# Patient Record
Sex: Female | Born: 1985 | Race: White | Hispanic: No | Marital: Married | Smoking: Former smoker
Health system: Southern US, Community
[De-identification: ages and names within clinical notes are randomized; demographics above are authoritative.]

## PROBLEM LIST (undated history)

## (undated) DIAGNOSIS — K5900 Constipation, unspecified: Secondary | ICD-10-CM

## (undated) DIAGNOSIS — I2510 Atherosclerotic heart disease of native coronary artery without angina pectoris: Secondary | ICD-10-CM

## (undated) DIAGNOSIS — E282 Polycystic ovarian syndrome: Secondary | ICD-10-CM

## (undated) DIAGNOSIS — G47419 Narcolepsy without cataplexy: Secondary | ICD-10-CM

## (undated) DIAGNOSIS — I341 Nonrheumatic mitral (valve) prolapse: Secondary | ICD-10-CM

## (undated) HISTORY — DX: Constipation, unspecified: K59.00

## (undated) HISTORY — DX: Polycystic ovarian syndrome: E28.2

## (undated) HISTORY — DX: Nonrheumatic mitral (valve) prolapse: I34.1

## (undated) HISTORY — PX: TONSILLECTOMY AND ADENOIDECTOMY: SHX28

## (undated) HISTORY — PX: CHOLECYSTECTOMY: SHX55

## (undated) HISTORY — DX: Narcolepsy without cataplexy: G47.419

## (undated) HISTORY — DX: Atherosclerotic heart disease of native coronary artery without angina pectoris: I25.10

---

## 2017-01-10 NOTE — Congregational Nurse Program (Signed)
Congregational Nurse Program Note  Date of Encounter: 01/10/2017  Past Medical History: No past medical history on file.  Encounter Details:     CNP Questionnaire - 12/21/16 1745      Patient Demographics   Is this a new or existing patient? New   Patient is considered a/an Not Applicable   Race Caucasian/White     Patient Assistance   Location of Patient Assistance Home of [address], [place]   Patient's financial/insurance status Medicaid   Uninsured Patient ([place] [facility]/Care Connects) No   Patient referred to apply for the following financial assistance Not Applicable   Food insecurities addressed Not Applicable   Transportation assistance Yes   Type of Assistance [facility]   Assistance securing medications No   Educational health offerings Chronic disease;Navigating the healthcare system     Encounter Details   Primary purpose of visit Chronic Illness/Condition Visit;Navigating the Healthcare System   Was an Emergency Department visit averted? Not Applicable   Does patient have a medical provider? Yes   Patient referred to Not Applicable   Was a mental health screening completed? (GAINS tool) No   Does patient have dental issues? No   Does patient have vision issues? No   Does your patient have an abnormal blood pressure today? No   Since previous encounter, have you referred patient for abnormal blood pressure that resulted in a new diagnosis or medication change? No   Does your patient have an abnormal blood glucose today? No   Since previous encounter, have you referred patient for abnormal blood glucose that resulted in a new diagnosis or medication change? No   Was there a life-saving intervention made? No    Client has chronic nephritis;on Bactrim 1 daily for 6 months. Had appointment  Today at [facility] but did not know to call [facility] 3 days in advance for transportation To call on Monday to reschedule another appointment Oswaldo Conroy, [facility], [phone]

## 2017-01-10 NOTE — Congregational Nurse Program (Deleted)
Congregational Nurse Program Note  Date of Encounter: 01/10/2017  Past Medical History: No past medical history on file.  Encounter Details:     CNP Questionnaire - 12/21/16 1745      Patient Demographics   Is this a new or existing patient? New   Patient is considered a/an Not Applicable   Race Caucasian/White     Patient Assistance   Location of Patient Assistance Home of [address], [place]   Patient's financial/insurance status Medicaid   Uninsured Patient ([place] Card/Care Connects) No   Patient referred to apply for the following financial assistance Not Applicable   Food insecurities addressed Not Applicable   Transportation assistance Yes   Type of Assistance [facility]   Assistance securing medications No   Educational health offerings Chronic disease;Navigating the healthcare system     Encounter Details   Primary purpose of visit Chronic Illness/Condition Visit;Navigating the Healthcare System   Was an Emergency Department visit averted? Not Applicable   Does patient have a medical provider? Yes   Patient referred to Not Applicable   Was a mental health screening completed? (GAINS tool) No   Does patient have dental issues? No   Does patient have vision issues? No   Does your patient have an abnormal blood pressure today? No   Since previous encounter, have you referred patient for abnormal blood pressure that resulted in a new diagnosis or medication change? No   Does your patient have an abnormal blood glucose today? No   Since previous encounter, have you referred patient for abnormal blood glucose that resulted in a new diagnosis or medication change? No   Was there a life-saving intervention made? No    Has chronic Nephritis;on Bactrim 1 daily for 6 months Had appointment today at [facility] but didn't know to call 3 days in advance for transportation. Told to call on Monday To reschedule dayn Mon

## 2017-05-14 ENCOUNTER — Encounter: Payer: Self-pay | Admitting: Gastroenterology

## 2017-06-18 ENCOUNTER — Encounter: Payer: Self-pay | Admitting: Gastroenterology

## 2017-07-25 ENCOUNTER — Encounter: Payer: Self-pay | Admitting: Nurse Practitioner

## 2017-07-25 ENCOUNTER — Ambulatory Visit (INDEPENDENT_AMBULATORY_CARE_PROVIDER_SITE_OTHER): Payer: Medicaid Other | Admitting: Nurse Practitioner

## 2017-07-25 DIAGNOSIS — R768 Other specified abnormal immunological findings in serum: Secondary | ICD-10-CM | POA: Insufficient documentation

## 2017-07-25 NOTE — Progress Notes (Signed)
cc'ed to pcp °

## 2017-07-25 NOTE — Addendum Note (Signed)
Addended by: Delane GingerGILL, Nitika Jackowski A on: 07/25/2017 11:10 AM   Modules accepted: Orders

## 2017-07-25 NOTE — Progress Notes (Signed)
Primary Care Physician:  Tylene Fantasia., PA-C Primary Gastroenterologist:  Dr. Darrick Penna  Chief Complaint  Patient presents with  . Hepatitis    c/o bilateral leg edema    HPI:   Carla York is a 31 y.o. female who presents On referral from primary care for hepatitis. PCP labs reviewed which show hepatitis C antibody reactive but RNA not detected. This could be a previously cleared infection versus false positive.  No labs in our system.  Today she states she's doing overall. Her husband has Hepatitis C, he has not been treated and has appointment for evaluation +/- treatment. Has intermittent abdominal pain after pregnancy and had RUQ pain after cholecystectomy, typically occurs once a month after surgery and currently only happens every 2-3 months, self resolved. Denies yellowing of skin/eyes. Has had darkened urine related to chronic kidney infection, needs urology followup. Acute episodic confusion. Has chronic tremors/shakes. Has increased fatigue, diagnosed with narcolepsy. Denies hematochezia, melena, acute changes in bowel habits, objective fever, unintentional weight loss. Chronic constipation on suboxone, was given senna but not taken it yet. Denies any other upper or lower GI symptoms.  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 CAD (coronary artery disease)   . Constipation   . MVP (mitral valve prolapse)   . Narcolepsy   . PCOS (polycystic ovarian syndrome)     Past Surgical History:  Procedure Laterality Date  . CHOLECYSTECTOMY    . TONSILLECTOMY AND ADENOIDECTOMY      Current Outpatient Prescriptions  Medication Sig Dispense  Refill  . hydrOXYzine (ATARAX/VISTARIL) 25 MG tablet Take 25 mg by mouth as needed.    Marland Kitchen spironolactone (ALDACTONE) 50 MG tablet Take 50 mg by mouth daily.    . SUBOXONE 8-2 MG FILM 2 (two) times daily.  0   No current facility-administered medications for this visit.     Allergies as of 07/25/2017 - Review Complete 07/25/2017  Allergen Reaction Noted  . Bee venom  07/25/2017    Family History  Problem Relation Age of Onset  . Colon cancer Neg Hx   . Liver disease Neg Hx     Social History   Social History  . Marital status: Married    Spouse name: N/A  . Number of children: N/A  . Years of education: N/A   Occupational History  . Not on file.   Social History Main Topics  . Smoking status: Former Games developer  . Smokeless tobacco: Never Used     Comment: uses vape  . Alcohol use No  . Drug use: No  . Sexual activity: Not on file   Other Topics Concern  . Not on file   Social History Narrative  . No narrative on file    Review of Systems: Complete ROS negative except as per HPI.    Physical Exam: BP 112/67   Pulse 89   Temp (!) 97.2 F (36.2 C) (Oral)   Ht 5\' 9"  (1.753 m)   Wt 268 lb 12.8 oz (121.9 kg)   BMI 39.69 kg/m  General:   Obese female. Alert and oriented. Pleasant and cooperative. Well-nourished and well-developed.  Eyes:  Without icterus, sclera clear  and conjunctiva pink.  Ears:  Normal auditory acuity. Cardiovascular:  S1, S2 present without murmurs appreciated. Extremities without clubbing or edema. Respiratory:  Clear to auscultation bilaterally. No wheezes, rales, or rhonchi. No distress.  Gastrointestinal:  +BS, obese but soft, non-tender and non-distended. No HSM noted. No guarding or rebound. No masses appreciated.  Rectal:  Deferred  Musculoskalatal:  Symmetrical without gross deformities. Neurologic:  Alert and oriented x4;  grossly normal neurologically. Psych:  Alert and cooperative. Normal mood and affect. Heme/Lymph/Immune: No  excessive bruising noted.    07/25/2017 10:57 AM   Disclaimer: This note was dictated with voice recognition software. Similar sounding words can inadvertently be transcribed and may not be corrected upon review.

## 2017-07-25 NOTE — Assessment & Plan Note (Addendum)
The patient was referred by primary care for positive hepatitis C antibody. However, her RNA was negative. She could have had an acute hepatitis E infection and spontaneously cleared or this could be a false positive. Her husband does have hepatitis C. 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 At this point I will recheck CBC, CMP, hepatitis C antibody, confirmation with RNA. I will have her follow-up in 2 months to review results and discuss any further workup needed and if treatment is needed.  A total minimum of 25 minutes was spent with the patient at least 50% was spent on counseling and education related to hepatitis C, risk factors, pathophysiology, workup options, treatment options and associated costs.

## 2017-07-25 NOTE — Patient Instructions (Signed)
1. Have your labs drawn when you're able to. 2. We will call you with the results. 3. Return for follow-up in 2 months. 4. Call us if you have any symptoms or concerns.

## 2017-09-24 ENCOUNTER — Ambulatory Visit: Payer: Medicaid Other | Admitting: Nurse Practitioner

## 2017-10-08 LAB — COMPREHENSIVE METABOLIC PANEL
AG Ratio: 1.4 (calc) (ref 1.0–2.5)
ALBUMIN MSPROF: 4.2 g/dL (ref 3.6–5.1)
ALT: 56 U/L — ABNORMAL HIGH (ref 6–29)
AST: 37 U/L — AB (ref 10–30)
Alkaline phosphatase (APISO): 64 U/L (ref 33–115)
BUN: 9 mg/dL (ref 7–25)
CHLORIDE: 106 mmol/L (ref 98–110)
CO2: 28 mmol/L (ref 20–32)
CREATININE: 0.83 mg/dL (ref 0.50–1.10)
Calcium: 9.6 mg/dL (ref 8.6–10.2)
GLOBULIN: 2.9 g/dL (ref 1.9–3.7)
GLUCOSE: 85 mg/dL (ref 65–139)
Potassium: 4.3 mmol/L (ref 3.5–5.3)
SODIUM: 140 mmol/L (ref 135–146)
TOTAL PROTEIN: 7.1 g/dL (ref 6.1–8.1)
Total Bilirubin: 2 mg/dL — ABNORMAL HIGH (ref 0.2–1.2)

## 2017-10-08 LAB — CBC WITH DIFFERENTIAL/PLATELET
BASOS PCT: 0.4 %
Basophils Absolute: 40 cells/uL (ref 0–200)
EOS PCT: 1 %
Eosinophils Absolute: 100 cells/uL (ref 15–500)
HCT: 44.4 % (ref 35.0–45.0)
HEMOGLOBIN: 14.8 g/dL (ref 11.7–15.5)
Lymphs Abs: 3760 cells/uL (ref 850–3900)
MCH: 28.8 pg (ref 27.0–33.0)
MCHC: 33.3 g/dL (ref 32.0–36.0)
MCV: 86.4 fL (ref 80.0–100.0)
MONOS PCT: 7.8 %
MPV: 10.6 fL (ref 7.5–12.5)
NEUTROS ABS: 5320 {cells}/uL (ref 1500–7800)
Neutrophils Relative %: 53.2 %
PLATELETS: 268 10*3/uL (ref 140–400)
RBC: 5.14 10*6/uL — AB (ref 3.80–5.10)
RDW: 12.1 % (ref 11.0–15.0)
TOTAL LYMPHOCYTE: 37.6 %
WBC mixed population: 780 cells/uL (ref 200–950)
WBC: 10 10*3/uL (ref 3.8–10.8)

## 2017-10-08 LAB — HEPATITIS C RNA QUANTITATIVE
HCV Quantitative Log: <1.18 Log IU/mL
HCV RNA, PCR, QN: NOT DETECTED [IU]/mL

## 2017-10-08 LAB — HEPATITIS C ANTIBODY
Hepatitis C Ab: REACTIVE — AB
SIGNAL TO CUT-OFF: 19 — AB (ref <1.00)

## 2017-10-08 LAB — HEPATITIS C GENOTYPE: HCV Genotype: NOT DETECTED

## 2017-10-10 ENCOUNTER — Telehealth: Payer: Self-pay | Admitting: Gastroenterology

## 2017-10-10 NOTE — Progress Notes (Signed)
Pt is aware.  

## 2017-10-10 NOTE — Progress Notes (Signed)
LMOM to call.

## 2017-10-10 NOTE — Telephone Encounter (Signed)
Patient called and stated someone from the office called her today, please call back if it was

## 2017-10-11 NOTE — Telephone Encounter (Signed)
See result note. Pt is aware.  

## 2017-10-17 NOTE — Progress Notes (Signed)
PT is aware. She said her last tattoo was about 7 years ago. She said her husband sees us and he does have Hep C.  They share tooth brushes sometimes. She said that she checked positive for Hep C last year. She has not had any incidents in healthcare.  She is aware to keep appt in Feb.  She said she will be losing her Medicaid at the end of February.

## 2017-10-17 NOTE — Progress Notes (Signed)
To Wynne DustEric Gill, NP.

## 2017-10-22 NOTE — Progress Notes (Signed)
Pt is aware.  

## 2017-11-21 ENCOUNTER — Ambulatory Visit: Payer: Medicaid Other | Admitting: Nurse Practitioner

## 2017-11-21 NOTE — Progress Notes (Deleted)
Referring Provider: Tylene Fantasia., PA-C Primary Care Physician:  Tylene Fantasia., PA-C Primary GI:  Dr. Darrick Penna  No chief complaint on file.   HPI:   Carla York is a 32 y.o. female who presents for follow-up on hepatitis C.  At her last visit she was doing well overall and noted her husband also has hepatitis C that has not been treated and has an appointment for evaluation for possible treatment upcoming.  Intermittent abdominal pain after pregnancy and right upper quadrant pain after cholecystectomy which typically occurs every 2-3 months and self resolves.  Needing urology follow-up due to kidney infection.  Has chronic tremors/shakes.  Also notes increased fatigue but she has been diagnosed with narcolepsy.  She has chronic constipation on Suboxone and is not tried her prescription for senna yet.  No other GI or hepatic symptoms.  Possible sources of transmission include tattoos and healthcare worker, although she is not sure if she was definitely exposed to any blood/needle sticks.  Commended serology workup and ultrasound elastography.  Follow-up in months.  Completed 10/04/2017 which found actually normal.  CMP found very Ruben of 2.0, mildly elevated AST/ALT at 37/56.  Hepatitis C antibody was positive.  Hepatitis C RNA was negative.  No genotype detected.  No right upper quadrant imaging was completed.  Today she states   SEROLOGY (HBV, HIV, AUTOIMMUNE?), [place] ELAS,   Past Medical History:  Diagnosis Date  . CAD (coronary artery disease)   . Constipation   . MVP (mitral valve prolapse)   . Narcolepsy   . PCOS (polycystic ovarian syndrome)     *** The histories are not reviewed yet. Please review them in the "History" navigator section and refresh this SmartLink.  Current Outpatient Medications  Medication Sig Dispense Refill  . hydrOXYzine (ATARAX/VISTARIL) 25 MG tablet Take 25 mg by mouth as needed.    Marland Kitchen spironolactone (ALDACTONE) 50 MG tablet Take 50 mg by mouth  daily.    . SUBOXONE 8-2 MG FILM 2 (two) times daily.  0   No current facility-administered medications for this visit.     Allergies as of 11/21/2017 - Review Complete 07/25/2017  Allergen Reaction Noted  . Bee venom  07/25/2017    Family History  Problem Relation Age of Onset  . Colon cancer Neg Hx   . Liver disease Neg Hx     Social History   Socioeconomic History  . Marital status: Married    Spouse name: Not on file  . Number of children: Not on file  . Years of education: Not on file  . Highest education level: Not on file  Social Needs  . Financial resource strain: Not on file  . Food insecurity - worry: Not on file  . Food insecurity - inability: Not on file  . Transportation needs - medical: Not on file  . Transportation needs - non-medical: Not on file  Occupational History  . Not on file  Tobacco Use  . Smoking status: Former Games developer  . Smokeless tobacco: Never Used  . Tobacco comment: uses vape  Substance and Sexual Activity  . Alcohol use: No  . Drug use: No  . Sexual activity: Not on file  Other Topics Concern  . Not on file  Social History Narrative  . Not on file    Review of Systems: Complete ROS negative except as per HPI.   Physical Exam: There were no vitals taken for this visit. General:   Alert and oriented. Pleasant  and cooperative. Well-nourished and well-developed.  Head:  Normocephalic and atraumatic. Eyes:  Without icterus, sclera clear and conjunctiva pink.  Ears:  Normal auditory acuity. Mouth:  No deformity or lesions, oral mucosa pink.  Throat/Neck:  Supple, without mass or thyromegaly. Cardiovascular:  S1, S2 present without murmurs appreciated. Normal pulses noted. Extremities without clubbing or edema. Respiratory:  Clear to auscultation bilaterally. No wheezes, rales, or rhonchi. No distress.  Gastrointestinal:  +BS, soft, non-tender and non-distended. No HSM noted. No guarding or rebound. No masses appreciated.  Rectal:   Deferred  Musculoskalatal:  Symmetrical without gross deformities. Normal posture. Skin:  Intact without significant lesions or rashes. Neurologic:  Alert and oriented x4;  grossly normal neurologically. Psych:  Alert and cooperative. Normal mood and affect. Heme/Lymph/Immune: No significant cervical adenopathy. No excessive bruising noted.    11/21/2017 8:23 AM   Disclaimer: This note was dictated with voice recognition software. Similar sounding words can inadvertently be transcribed and may not be corrected upon review.

## 2017-12-12 ENCOUNTER — Encounter: Payer: Self-pay | Admitting: Nurse Practitioner

## 2017-12-12 ENCOUNTER — Ambulatory Visit: Payer: Medicaid Other | Admitting: Nurse Practitioner

## 2017-12-12 ENCOUNTER — Telehealth: Payer: Self-pay | Admitting: Nurse Practitioner

## 2017-12-12 NOTE — Telephone Encounter (Signed)
Noted  

## 2017-12-12 NOTE — Telephone Encounter (Signed)
PATIENT WAS A NO SHOW AND LETTER SENT  °

## 2017-12-12 NOTE — Progress Notes (Deleted)
Referring Provider: Tylene FantasiaMuse, Rochelle D., PA-C Primary Care Physician:  Tylene FantasiaMuse, Rochelle D., PA-C Primary GI:  Dr. Darrick PennaFields  No chief complaint on file.   HPI:   Carla York is a 32 y.o. female who presents for follow-up on hepatitis C.  The patient was last seen in our office 07/25/2017 for the same.  That time she had been referred by primary care for hep C antibody positive but RNA not detected.  At her last visit she was doing well, husband hepatitis C positive and is not yet undergone treatment.  Some abdominal pain after pregnancy and right upper quadrant pain after cholecystectomy about once a month.  Denies hepatic symptoms.  Noted chronic tremors/shakes.  Increased fatigue status post diagnosis with narcolepsy.  No other GI symptoms.  She did have an increased risk factor of having a tattoo although sterile technique was followed per the patient.  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  Hep C positive possibly acute infection spontaneously cleared or false positive.  Recommended CBC, CMP, hepatitis C antibody and confirmation with RNA to double check her results.  Follow-up in 6 months.  CBC essentially normal.  AST/ALT mildly elevated at 37/56, early [place] mildly elevated at 2.0.  Otitis C antibody was again reactive but RNA not detected.  Commended she not share toothbrushes with her husband who is hep C positive.  Recommended follow-up in 6 months and consider retesting of hepatitis C RNA to ensure it is not a recent infection not yet detectable by RNA.  Today she states   Past Medical History:  Diagnosis Date  . CAD (coronary artery disease)   . Constipation   . MVP (mitral valve prolapse)   . Narcolepsy   . PCOS (polycystic ovarian syndrome)     *** The histories are not reviewed yet. Please review them in the "History" navigator section and refresh this SmartLink.  Current Outpatient Medications  Medication Sig Dispense  Refill  . hydrOXYzine (ATARAX/VISTARIL) 25 MG tablet Take 25 mg by mouth as needed.    Marland Kitchen. spironolactone (ALDACTONE) 50 MG tablet Take 50 mg by mouth daily.    . SUBOXONE 8-2 MG FILM 2 (two) times daily.  0   No current facility-administered medications for this visit.     Allergies as of 12/12/2017 - Review Complete 07/25/2017  Allergen Reaction Noted  . Bee venom  07/25/2017    Family History  Problem Relation Age of Onset  . Colon cancer Neg Hx   . Liver disease Neg Hx     Social History   Socioeconomic History  . Marital status: Married    Spouse name: Not on file  . Number of children: Not on file  . Years of education: Not on file  . Highest education level: Not on file  Social Needs  . Financial resource strain: Not on file  . Food insecurity - worry: Not on file  . Food insecurity - inability: Not on file  . Transportation needs - medical: Not on file  . Transportation needs - non-medical: Not on file  Occupational History  . Not on file  Tobacco Use  . Smoking status: Former Games developermoker  . Smokeless tobacco: Never Used  . Tobacco comment: uses vape  Substance and Sexual Activity  . Alcohol use: No  . Drug use: No  . Sexual activity: Not on file  Other Topics Concern  . Not on file  Social History Narrative  .  Not on file    Review of Systems: General: Negative for anorexia, weight loss, fever, chills, fatigue, weakness. Eyes: Negative for vision changes.  ENT: Negative for hoarseness, difficulty swallowing , nasal congestion. CV: Negative for chest pain, angina, palpitations, dyspnea on exertion, peripheral edema.  Respiratory: Negative for dyspnea at rest, dyspnea on exertion, cough, sputum, wheezing.  GI: See history of present illness. GU:  Negative for dysuria, hematuria, urinary incontinence, urinary frequency, nocturnal urination.  MS: Negative for joint pain, low back pain.  Derm: Negative for rash or itching.  Neuro: Negative for weakness,  abnormal sensation, seizure, frequent headaches, memory loss, confusion.  Psych: Negative for anxiety, depression, suicidal ideation, hallucinations.  Endo: Negative for unusual weight change.  Heme: Negative for bruising or bleeding. Allergy: Negative for rash or hives.   Physical Exam: There were no vitals taken for this visit. General:   Alert and oriented. Pleasant and cooperative. Well-nourished and well-developed.  Head:  Normocephalic and atraumatic. Eyes:  Without icterus, sclera clear and conjunctiva pink.  Ears:  Normal auditory acuity. Mouth:  No deformity or lesions, oral mucosa pink.  Throat/Neck:  Supple, without mass or thyromegaly. Cardiovascular:  S1, S2 present without murmurs appreciated. Normal pulses noted. Extremities without clubbing or edema. Respiratory:  Clear to auscultation bilaterally. No wheezes, rales, or rhonchi. No distress.  Gastrointestinal:  +BS, soft, non-tender and non-distended. No HSM noted. No guarding or rebound. No masses appreciated.  Rectal:  Deferred  Musculoskalatal:  Symmetrical without gross deformities. Normal posture. Skin:  Intact without significant lesions or rashes. Neurologic:  Alert and oriented x4;  grossly normal neurologically. Psych:  Alert and cooperative. Normal mood and affect. Heme/Lymph/Immune: No significant cervical adenopathy. No excessive bruising noted.    12/12/2017 8:00 AM   Disclaimer: This note was dictated with voice recognition software. Similar sounding words can inadvertently be transcribed and may not be corrected upon review.

## 2019-12-09 ENCOUNTER — Other Ambulatory Visit (HOSPITAL_COMMUNITY): Payer: Self-pay | Admitting: *Deleted

## 2019-12-09 DIAGNOSIS — N6489 Other specified disorders of breast: Secondary | ICD-10-CM

## 2019-12-23 ENCOUNTER — Encounter (HOSPITAL_COMMUNITY): Payer: Self-pay

## 2019-12-23 ENCOUNTER — Ambulatory Visit (HOSPITAL_COMMUNITY)
Admission: RE | Admit: 2019-12-23 | Discharge: 2019-12-23 | Disposition: A | Discharge status: Home / Self Care | Payer: PRIVATE HEALTH INSURANCE | Source: Ambulatory Visit | Attending: *Deleted | Admitting: *Deleted

## 2019-12-23 ENCOUNTER — Other Ambulatory Visit: Payer: Self-pay

## 2019-12-23 DIAGNOSIS — N6489 Other specified disorders of breast: Secondary | ICD-10-CM | POA: Insufficient documentation

## 2020-07-22 ENCOUNTER — Other Ambulatory Visit: Payer: Self-pay

## 2020-07-22 ENCOUNTER — Encounter (HOSPITAL_COMMUNITY): Payer: Self-pay | Admitting: Emergency Medicine

## 2020-07-22 ENCOUNTER — Emergency Department (HOSPITAL_COMMUNITY)
Admission: EM | Admit: 2020-07-22 | Discharge: 2020-07-23 | Disposition: A | Discharge status: Home / Self Care | Payer: Self-pay | Attending: Emergency Medicine | Admitting: Emergency Medicine

## 2020-07-22 DIAGNOSIS — K029 Dental caries, unspecified: Secondary | ICD-10-CM | POA: Insufficient documentation

## 2020-07-22 DIAGNOSIS — Z20822 Contact with and (suspected) exposure to covid-19: Secondary | ICD-10-CM | POA: Insufficient documentation

## 2020-07-22 DIAGNOSIS — E876 Hypokalemia: Secondary | ICD-10-CM | POA: Insufficient documentation

## 2020-07-22 DIAGNOSIS — R17 Unspecified jaundice: Secondary | ICD-10-CM

## 2020-07-22 DIAGNOSIS — Z87891 Personal history of nicotine dependence: Secondary | ICD-10-CM | POA: Insufficient documentation

## 2020-07-22 DIAGNOSIS — I2510 Atherosclerotic heart disease of native coronary artery without angina pectoris: Secondary | ICD-10-CM | POA: Insufficient documentation

## 2020-07-22 DIAGNOSIS — R509 Fever, unspecified: Secondary | ICD-10-CM | POA: Insufficient documentation

## 2020-07-22 DIAGNOSIS — R7401 Elevation of levels of liver transaminase levels: Secondary | ICD-10-CM | POA: Insufficient documentation

## 2020-07-22 DIAGNOSIS — E871 Hypo-osmolality and hyponatremia: Secondary | ICD-10-CM | POA: Insufficient documentation

## 2020-07-22 NOTE — ED Triage Notes (Signed)
Pt complains of bad tooth since age 34  Reports abscessed tooth for the last 2 days with pan and swelling to her R upper jaw  Here for eval

## 2020-07-22 NOTE — ED Notes (Addendum)
Pt reports bad liver so she does not take ibuprofen   On suboxone   Last tylenol 2200 per her report   Awaiting eval

## 2020-07-23 ENCOUNTER — Emergency Department (HOSPITAL_COMMUNITY): Payer: Self-pay

## 2020-07-23 LAB — COMPREHENSIVE METABOLIC PANEL
ALT: 140 U/L — ABNORMAL HIGH (ref 0–44)
AST: 90 U/L — ABNORMAL HIGH (ref 15–41)
Albumin: 4.4 g/dL (ref 3.5–5.0)
Alkaline Phosphatase: 82 U/L (ref 38–126)
Anion gap: 10 (ref 5–15)
BUN: 10 mg/dL (ref 6–20)
CO2: 24 mmol/L (ref 22–32)
Calcium: 9.3 mg/dL (ref 8.9–10.3)
Chloride: 100 mmol/L (ref 98–111)
Creatinine, Ser: 1.03 mg/dL — ABNORMAL HIGH (ref 0.44–1.00)
GFR, Estimated: >60 mL/min (ref >60)
Glucose, Bld: 105 mg/dL — ABNORMAL HIGH (ref 70–99)
Potassium: 3.4 mmol/L — ABNORMAL LOW (ref 3.5–5.1)
Sodium: 134 mmol/L — ABNORMAL LOW (ref 135–145)
Total Bilirubin: 4 mg/dL — ABNORMAL HIGH (ref 0.3–1.2)
Total Protein: 8.2 g/dL — ABNORMAL HIGH (ref 6.5–8.1)

## 2020-07-23 LAB — CBC WITH DIFFERENTIAL/PLATELET
Abs Immature Granulocytes: 0.04 10*3/uL (ref 0.00–0.07)
Basophils Absolute: 0.1 10*3/uL (ref 0.0–0.1)
Basophils Relative: 0 %
Eosinophils Absolute: 0 10*3/uL (ref 0.0–0.5)
Eosinophils Relative: 0 %
HCT: 43.1 % (ref 36.0–46.0)
Hemoglobin: 14.3 g/dL (ref 12.0–15.0)
Immature Granulocytes: 0 %
Lymphocytes Relative: 12 %
Lymphs Abs: 1.5 10*3/uL (ref 0.7–4.0)
MCH: 30.8 pg (ref 26.0–34.0)
MCHC: 33.2 g/dL (ref 30.0–36.0)
MCV: 92.9 fL (ref 80.0–100.0)
Monocytes Absolute: 1 10*3/uL (ref 0.1–1.0)
Monocytes Relative: 8 %
Neutro Abs: 10.5 10*3/uL — ABNORMAL HIGH (ref 1.7–7.7)
Neutrophils Relative %: 80 %
Platelets: 240 10*3/uL (ref 150–400)
RBC: 4.64 MIL/uL (ref 3.87–5.11)
RDW: 12.5 % (ref 11.5–15.5)
WBC: 13.1 10*3/uL — ABNORMAL HIGH (ref 4.0–10.5)
nRBC: 0 % (ref 0.0–0.2)

## 2020-07-23 LAB — LACTIC ACID, PLASMA: Lactic Acid, Venous: 1.1 mmol/L (ref 0.5–1.9)

## 2020-07-23 LAB — APTT: aPTT: 43 seconds — ABNORMAL HIGH (ref 24–36)

## 2020-07-23 LAB — GROUP A STREP BY PCR: Group A Strep by PCR: NOT DETECTED

## 2020-07-23 LAB — PROTIME-INR
INR: 1.2 (ref 0.8–1.2)
Prothrombin Time: 14.3 seconds (ref 11.4–15.2)

## 2020-07-23 LAB — RESPIRATORY PANEL BY RT PCR (FLU A&B, COVID)
Influenza A by PCR: NEGATIVE
Influenza B by PCR: NEGATIVE
SARS Coronavirus 2 by RT PCR: NEGATIVE

## 2020-07-23 MED ORDER — CLINDAMYCIN PHOSPHATE 600 MG/50ML IV SOLN
600.0000 mg | Freq: Once | INTRAVENOUS | Status: AC
  Administered 2020-07-23: 600 mg via INTRAVENOUS
  Filled 2020-07-23: qty 50

## 2020-07-23 MED ORDER — LACTATED RINGERS IV SOLN: INTRAVENOUS | Status: DC

## 2020-07-23 MED ORDER — DEXAMETHASONE SODIUM PHOSPHATE 10 MG/ML IJ SOLN
10.0000 mg | Freq: Once | INTRAMUSCULAR | Status: AC
  Administered 2020-07-23: 10 mg via INTRAVENOUS
  Filled 2020-07-23: qty 1

## 2020-07-23 MED ORDER — POTASSIUM CHLORIDE CRYS ER 20 MEQ PO TBCR
40.0000 meq | EXTENDED_RELEASE_TABLET | Freq: Once | ORAL | Status: AC
  Administered 2020-07-23: 40 meq via ORAL
  Filled 2020-07-23: qty 2

## 2020-07-23 MED ORDER — LACTATED RINGERS IV BOLUS (SEPSIS)
1000.0000 mL | Freq: Once | INTRAVENOUS | Status: AC
  Administered 2020-07-23: 1000 mL via INTRAVENOUS

## 2020-07-23 MED ORDER — IOHEXOL 300 MG/ML  SOLN
75.0000 mL | Freq: Once | INTRAMUSCULAR | Status: AC | PRN
  Administered 2020-07-23: 75 mL via INTRAVENOUS

## 2020-07-23 MED ORDER — CLINDAMYCIN HCL 150 MG PO CAPS: 150.0000 mg | ORAL_CAPSULE | Freq: Four times a day (QID) | ORAL | 0 refills | Status: AC

## 2020-07-23 NOTE — ED Notes (Signed)
Dr Preston Fleeting has seen

## 2020-07-23 NOTE — ED Notes (Signed)
To CT

## 2020-07-23 NOTE — Discharge Instructions (Signed)
Return if symptoms are getting worse. °

## 2020-07-23 NOTE — ED Provider Notes (Signed)
[facility] EMERGENCY DEPARTMENT Provider Note   CSN: [id number] Arrival date & time: 07/22/20  2324   History Chief Complaint  Patient presents with  . Dental Pain    R upper jaw    Carla York is a 34 y.o. female.  The history is provided by the patient.  Dental Pain She has history of mitral valve prolapse, polycystic ovarian syndrome, coronary artery disease, hepatitis C and comes in complaining of a dental abscess.  She has had chronic pain with a left upper tooth.  Today, pain was worse and she noted some swelling and fever as well as chills and sweats.  She noted a taste in her mouth and she thinks that there is an abscess that is draining.  She says that it is hard to swallow, and her throat is sore.  She has taken acetaminophen with little relief.  She cannot take ibuprofen.  She is on Suboxone.  Past Medical History:  Diagnosis Date  . CAD (coronary artery disease)   . Constipation   . MVP (mitral valve prolapse)   . Narcolepsy   . PCOS (polycystic ovarian syndrome)     Patient Active Problem List   Diagnosis Date Noted  . Hepatitis C antibody test positive 07/25/2017    Past Surgical History:  Procedure Laterality Date  . CHOLECYSTECTOMY    . TONSILLECTOMY AND ADENOIDECTOMY       OB History   No obstetric history on file.     Family History  Problem Relation Age of Onset  . Breast cancer Maternal Grandfather   . Colon cancer Neg Hx   . Liver disease Neg Hx     Social History   Tobacco Use  . Smoking status: Former Games developer  . Smokeless tobacco: Never Used  . Tobacco comment: uses vape  Vaping Use  . Vaping Use: Every day  Substance Use Topics  . Alcohol use: No  . Drug use: No    Home Medications Prior to Admission medications   Medication Sig Start Date End Date Taking? Authorizing Provider  hydrOXYzine (ATARAX/VISTARIL) 25 MG tablet Take 25 mg by mouth as needed.    [provider]  spironolactone (ALDACTONE) 50 MG tablet  Take 50 mg by mouth daily.    [provider]  SUBOXONE 8-2 MG FILM 2 (two) times daily. 07/12/17   [provider]    Allergies    Bee venom  Review of Systems   Review of Systems  All other systems reviewed and are negative.   Physical Exam Updated Vital Signs BP 131/72 (BP Location: Right Arm)   Pulse (!) 114   Temp (!) 103 F (39.4 C) (Oral) Comment: last tylenol 2200 per pt  Resp 20   Ht 5\' 11"  (1.803 m)   Wt 108.9 kg   LMP 08/15/2013   SpO2 96%   BMI 33.47 kg/m   Physical Exam Vitals and nursing note reviewed.   34 year old female, resting comfortably and in no acute distress. Vital signs are significant for high temperature and heart rate. Oxygen saturation is 96%, which is normal. Head is normocephalic and atraumatic. PERRLA, EOMI. there is no obvious facial swelling.  Tooth #12 is markedly carious with some mild swelling of the gingiva without erythema or pallor.  There is tenderness to palpation in this area.  There is no obvious drainage.  There is no pooling of secretions.  Phonation is normal. Neck is nontender and supple without adenopathy or JVD. Back is  nontender and there is no CVA tenderness. Lungs are clear without rales, wheezes, or rhonchi. Chest is nontender. Heart has regular rate and rhythm without murmur. Abdomen is soft, flat, nontender without masses or hepatosplenomegaly and peristalsis is normoactive. Extremities have no cyanosis or edema, full range of motion is present. Skin is warm and dry without rash. Neurologic: Mental status is normal, cranial nerves are intact, there are no motor or sensory deficits.  ED Results / Procedures / Treatments   Labs (all labs ordered are listed, but only abnormal results are displayed) Labs Reviewed  COMPREHENSIVE METABOLIC PANEL - Abnormal; Notable for the following components:      Result Value   Sodium 134 (*)    Potassium 3.4 (*)    Glucose, Bld 105 (*)    Creatinine, Ser 1.03 (*)     Total Protein 8.2 (*)    AST 90 (*)    ALT 140 (*)    Total Bilirubin 4.0 (*)    All other components within normal limits  CBC WITH DIFFERENTIAL/PLATELET - Abnormal; Notable for the following components:   WBC 13.1 (*)    Neutro Abs 10.5 (*)    All other components within normal limits  APTT - Abnormal; Notable for the following components:   aPTT 43 (*)    All other components within normal limits  GROUP A STREP BY PCR  RESPIRATORY PANEL BY RT PCR (FLU A&B, COVID)  URINE CULTURE  CULTURE, BLOOD (ROUTINE X 2)  CULTURE, BLOOD (ROUTINE X 2)  LACTIC ACID, PLASMA  PROTIME-INR  LACTIC ACID, PLASMA  URINALYSIS, ROUTINE W REFLEX MICROSCOPIC  PREGNANCY, URINE  POC URINE PREG, ED    EKG EKG Interpretation  Date/Time:  Friday July 23 2020 01:04:04 EDT Ventricular Rate:  100 PR Interval:    QRS Duration: 93 QT Interval:  313 QTC Calculation: 404 R Axis:   50 Text Interpretation: Sinus tachycardia Borderline T abnormalities, anterior leads No old tracing to compare Confirmed by Dione Booze ([id number]) on 07/23/2020 1:13:59 AM   Radiology CT Soft Tissue Neck W Contrast  Result Date: 07/23/2020 CLINICAL DATA:  Abscessed tooth.  Right facial pain EXAM: CT NECK WITH CONTRAST TECHNIQUE: Multidetector CT imaging of the neck was performed using the standard protocol following the bolus administration of intravenous contrast. CONTRAST:  18mL OMNIPAQUE IOHEXOL 300 MG/ML  SOLN COMPARISON:  None. FINDINGS: Pharynx and larynx: Normal. No mass or swelling. Salivary glands: No inflammation, mass, or stone. Thyroid: Normal. Lymph nodes: Multiple enlarged cervical lymph nodes. At level 2A there is a 14 mm node. At left level 2A, there is also 14 mm node. Vascular: Negative. Limited intracranial: Negative. Visualized orbits: Negative. Mastoids and visualized paranasal sinuses: Left maxillary sinus mucosal thickening. Skeleton: No acute or aggressive process. Upper chest: Negative. Other: None.  IMPRESSION: 1. No visible abscess or drainable fluid collection. There is poor dentition but no obvious source of odontogenic infection. 2. Multiple enlarged cervical lymph nodes, likely reactive. Electronically Signed   By: Deatra Robinson M.D.   On: 07/23/2020 01:38   DG Chest Port 1 View  Result Date: 07/23/2020 CLINICAL DATA:  Questionable sepsis. Mitral valve prolapse. Former smoker. Being tested for COVID-19. EXAM: PORTABLE CHEST 1 VIEW COMPARISON:  Chest x-ray 12/24/2016 report without imaging. FINDINGS: The heart size and mediastinal contours are within normal limits. No focal consolidation. No pulmonary edema. No pleural effusion. No pneumothorax. No acute osseous abnormality. IMPRESSION: No active disease. Electronically Signed   By: Normajean Glasgow.D.  On: 07/23/2020 00:56    Procedures Procedures CRITICAL CARE Performed by: Dione Booze Total critical care time: 45 minutes Critical care time was exclusive of separately billable procedures and treating other patients. Critical care was necessary to treat or prevent imminent or life-threatening deterioration. Critical care was time spent personally by me on the following activities: development of treatment plan with patient and/or surrogate as well as nursing, discussions with consultants, evaluation of patient's response to treatment, examination of patient, obtaining history from patient or surrogate, ordering and performing treatments and interventions, ordering and review of laboratory studies, ordering and review of radiographic studies, pulse oximetry and re-evaluation of patient's condition.  Medications Ordered in ED Medications  lactated ringers infusion ( Intravenous New Bag/Given 07/23/20 0045)  dexamethasone (DECADRON) injection 10 mg (has no administration in time range)  lactated ringers bolus 1,000 mL (0 mLs Intravenous Stopped 07/23/20 0248)  clindamycin (CLEOCIN) IVPB 600 mg (0 mg Intravenous Stopped 07/23/20 0204)   iohexol (OMNIPAQUE) 300 MG/ML solution 75 mL (75 mLs Intravenous Contrast Given 07/23/20 0116)  potassium chloride SA (KLOR-CON) CR tablet 40 mEq (40 mEq Oral Given 07/23/20 0247)    ED Course  I have reviewed the triage vital signs and the nursing notes.  Pertinent labs & imaging results that were available during my care of the patient were reviewed by me and considered in my medical decision making (see chart for details).  MDM Rules/Calculators/A&P Dental pain secondary to caries but no obvious abscess.  Fever with tachycardia concerning for sepsis.  With her report of difficulty swelling, will send for CT of the neck to make sure that there is no deep soft tissue infection such as Ludewig's angina.  She is started on the evolving sepsis pathway and will be started on clindamycin empirically.  Old records are reviewed, and she has no relevant past visits.  Chest x-ray shows no evidence of pneumonia.  CT scan shows no abscess or deep infection.  Strep screen is negative as is influenza and COVID-19.  Lactic acid level is normal, no evidence of sepsis.  Mild hypokalemia noted and she is given a dose of oral potassium.  Mild hyponatremia is present not felt to be clinically significant.  Elevation of transaminases and bilirubin presumably secondary to known history of hepatitis C.  Temperature has come down.  She does not appear toxic.  With normal lactic acid level, I feel she can safely be treated as an outpatient.  She is given a dose of dexamethasone and discharged with prescription for clindamycin and is referred to on-call dentist for definitive care of her tooth.  Return precautions discussed.  Final Clinical Impression(s) / ED Diagnoses Final diagnoses:  Pain due to dental caries  Fever in adult  Serum total bilirubin elevated  Elevated transaminase level  Hypokalemia  Hyponatremia    Rx / DC Orders ED Discharge Orders    None       Dione Booze, MD 07/23/20 [phone]

## 2020-07-23 NOTE — ED Notes (Signed)
Pt is a single mother w three children 2,5,7

## 2020-07-28 LAB — CULTURE, BLOOD (ROUTINE X 2)
Culture: NO GROWTH
Culture: NO GROWTH
Special Requests: ADEQUATE
Special Requests: ADEQUATE

## 2022-05-22 IMAGING — CT CT NECK W/ CM
3 of 4 series · 14 of 33 positions shown, 17 images · IV contrast (Omnipaque or Isovue)
Comparison: None.

CLINICAL DATA: Abscessed tooth.  Right facial pain

EXAM:
CT NECK WITH CONTRAST
TECHNIQUE: Multidetector CT imaging of the neck was performed using the
standard protocol following the bolus administration of intravenous
contrast.
CONTRAST:  75mL OMNIPAQUE IOHEXOL 300 MG/ML  SOLN

[Series 4: cor neck · coronal · 0.40mm/px · 3 of 115 slices shown]
[im 23/115  bone]
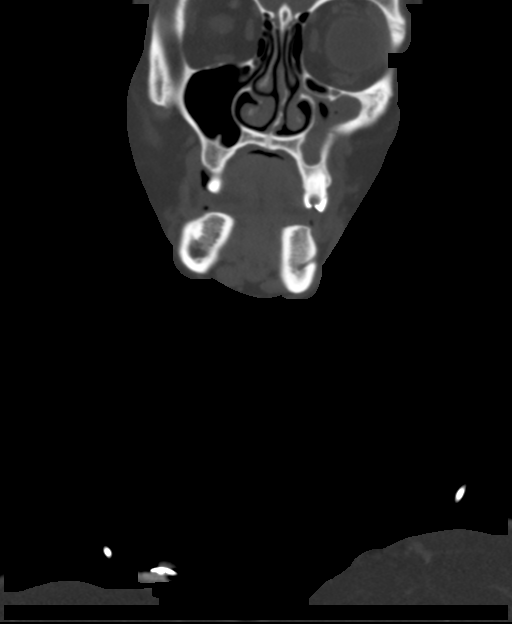
[im 46/115  bone]
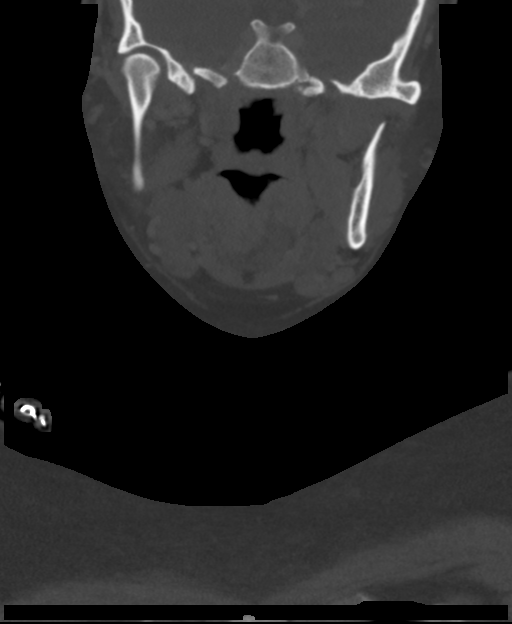
[im 69/115  bone]
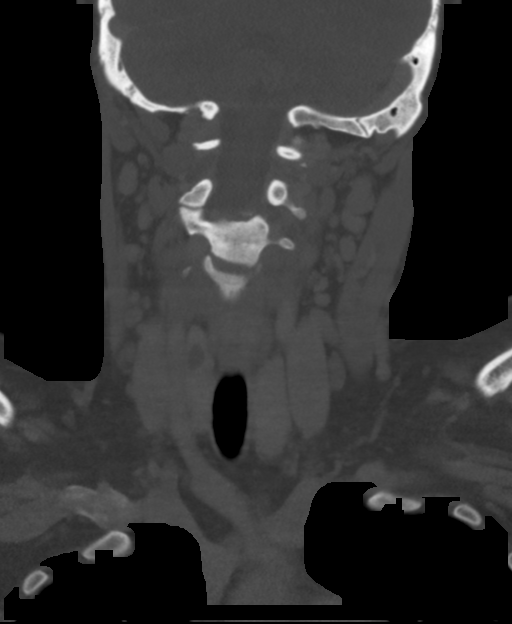

[Series 5: sag neck · sagittal · 0.45mm/px · 5 of 88 slices shown, 6 images]
[im 30/88  bone]
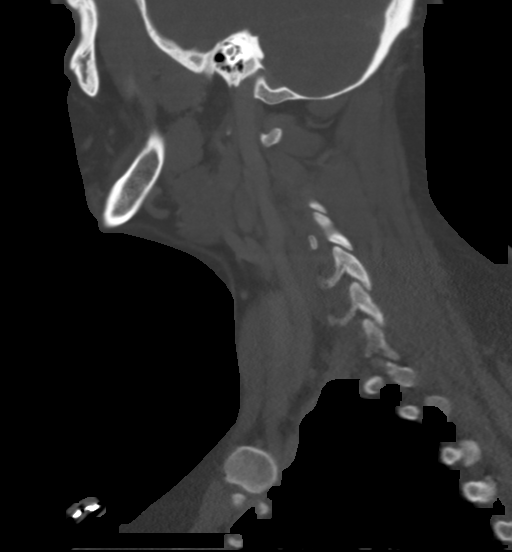
[im 37/88  bone]
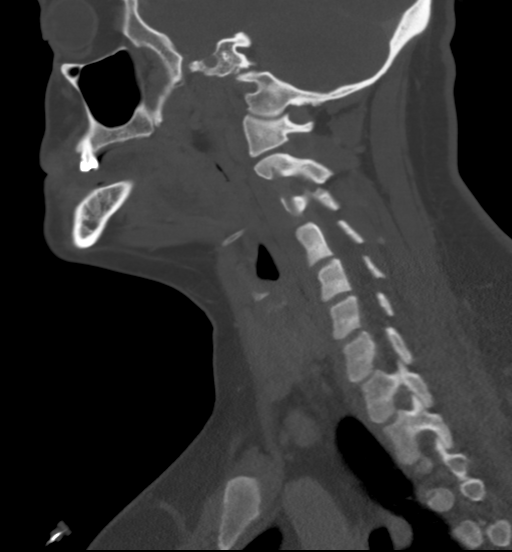
[im 44/88  soft-tissue]
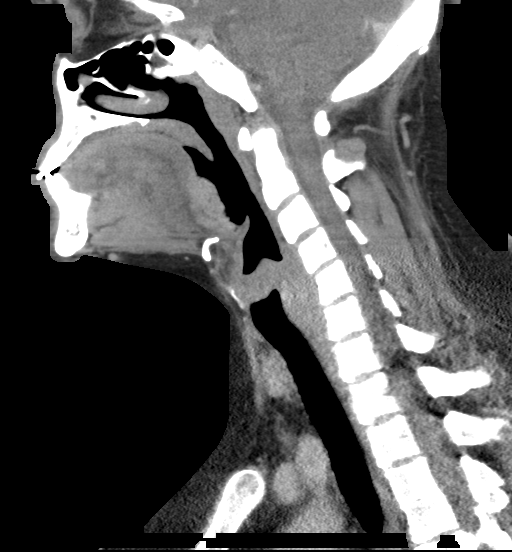
[im 44/88  bone]
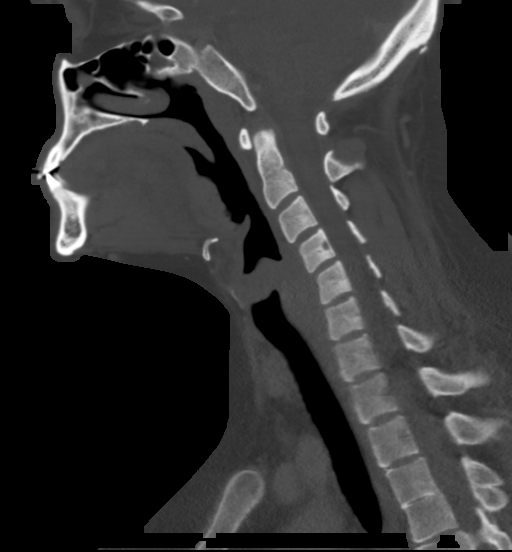
[im 51/88  bone]
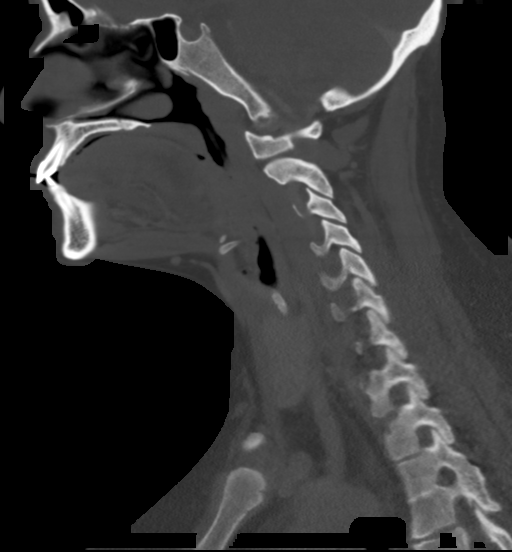
[im 59/88  bone]
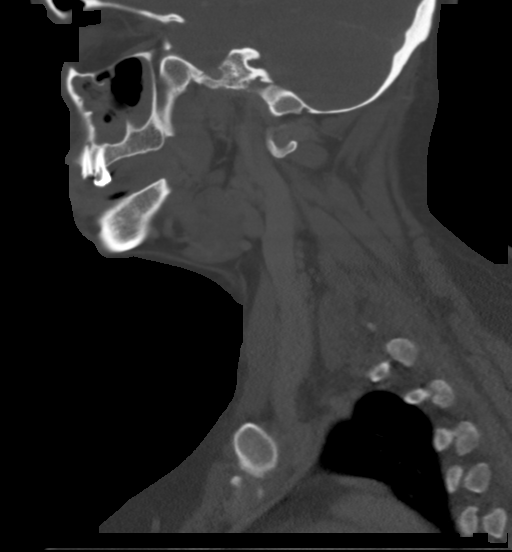

[Series 6: ax oropharynx · axial · 0.39mm/px · z∈[+213,+383]mm · 6 of 149 slices shown, 8 images]
[im 22/149  soft-tissue]
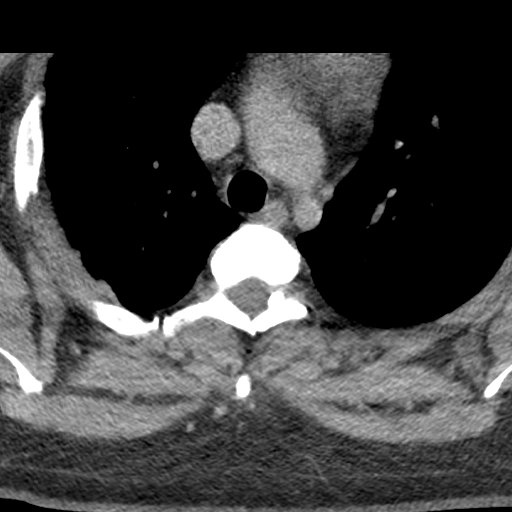
[im 22/149  bone]
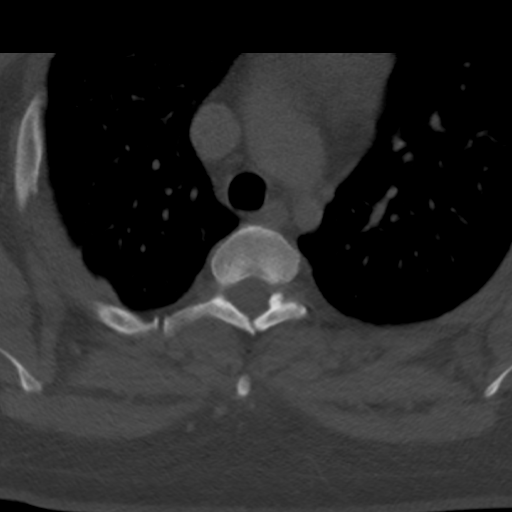
[im 43/149  bone]
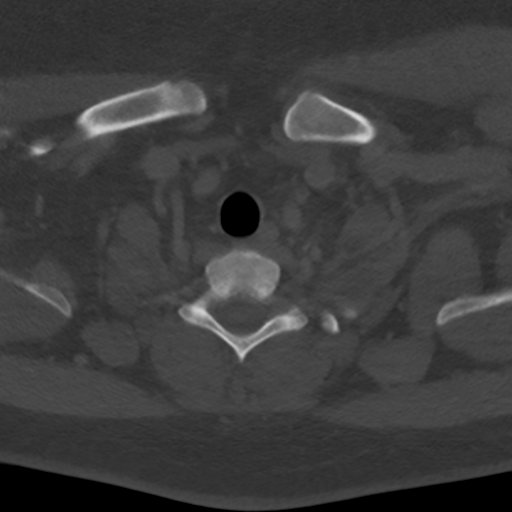
[im 64/149  bone]
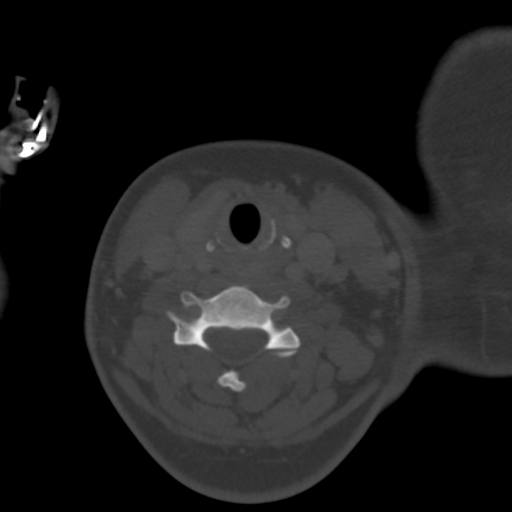
[im 85/149  bone]
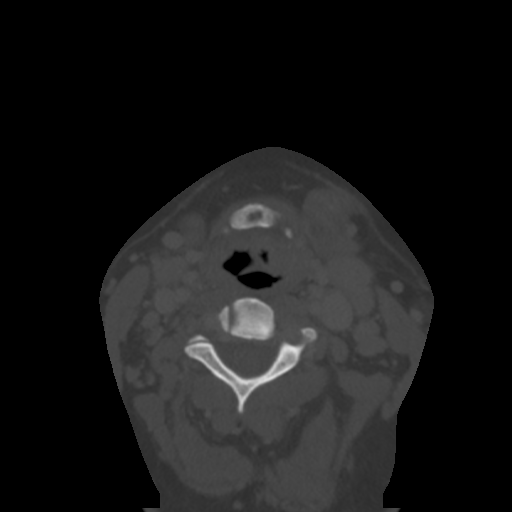
[im 106/149  soft-tissue]
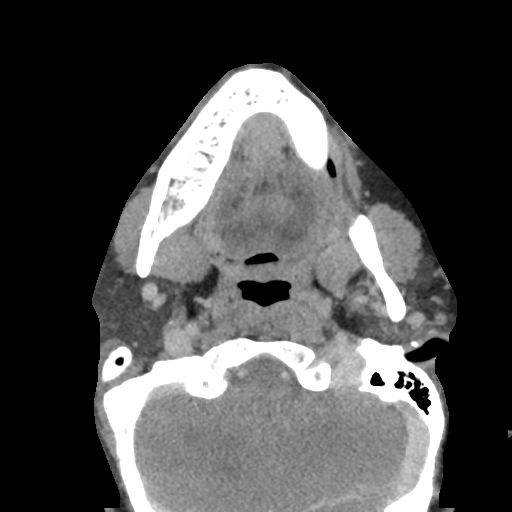
[im 106/149  bone]
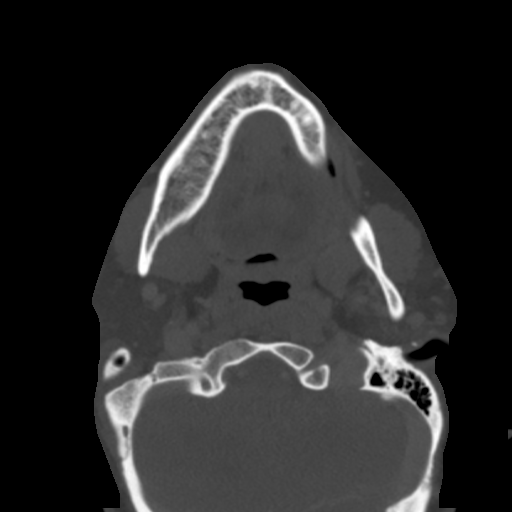
[im 127/149  bone]
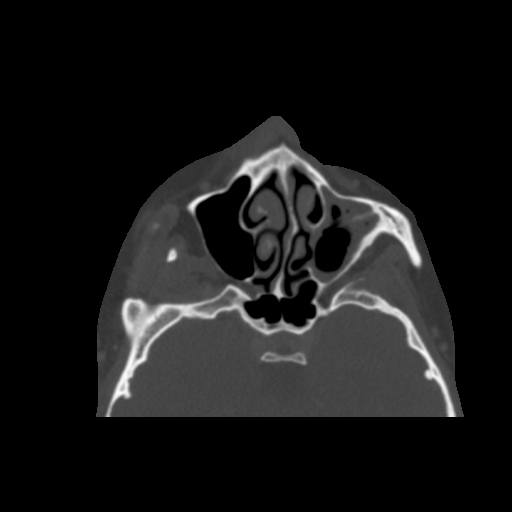

[14 of 33 positions shown; findings below may reference images not displayed]

FINDINGS: Pharynx and larynx: Normal. No mass or swelling.

Salivary glands: No inflammation, mass, or stone.

Thyroid: Normal.

Lymph nodes: Multiple enlarged cervical lymph nodes. At level 2A
there is a 14 mm node. At left level 2A, there is also 14 mm node.

Vascular: Negative.

Limited intracranial: Negative.

Visualized orbits: Negative.

Mastoids and visualized paranasal sinuses: Left maxillary sinus
mucosal thickening.

Skeleton: No acute or aggressive process.

Upper chest: Negative.

Other: None.
IMPRESSION: 1. No visible abscess or drainable fluid collection. There is poor
dentition but no obvious source of odontogenic infection.
2. Multiple enlarged cervical lymph nodes, likely reactive.

## 2022-05-22 IMAGING — DX DG CHEST 1V PORT
1 series · 1 of 1 positions shown · non-contrast
Comparison: Chest x-ray 12/24/2016 report without imaging.

CLINICAL DATA: Questionable sepsis. Mitral valve prolapse. Former
smoker. Being tested for [id number].

EXAM:
PORTABLE CHEST 1 VIEW

[chest ap grid]
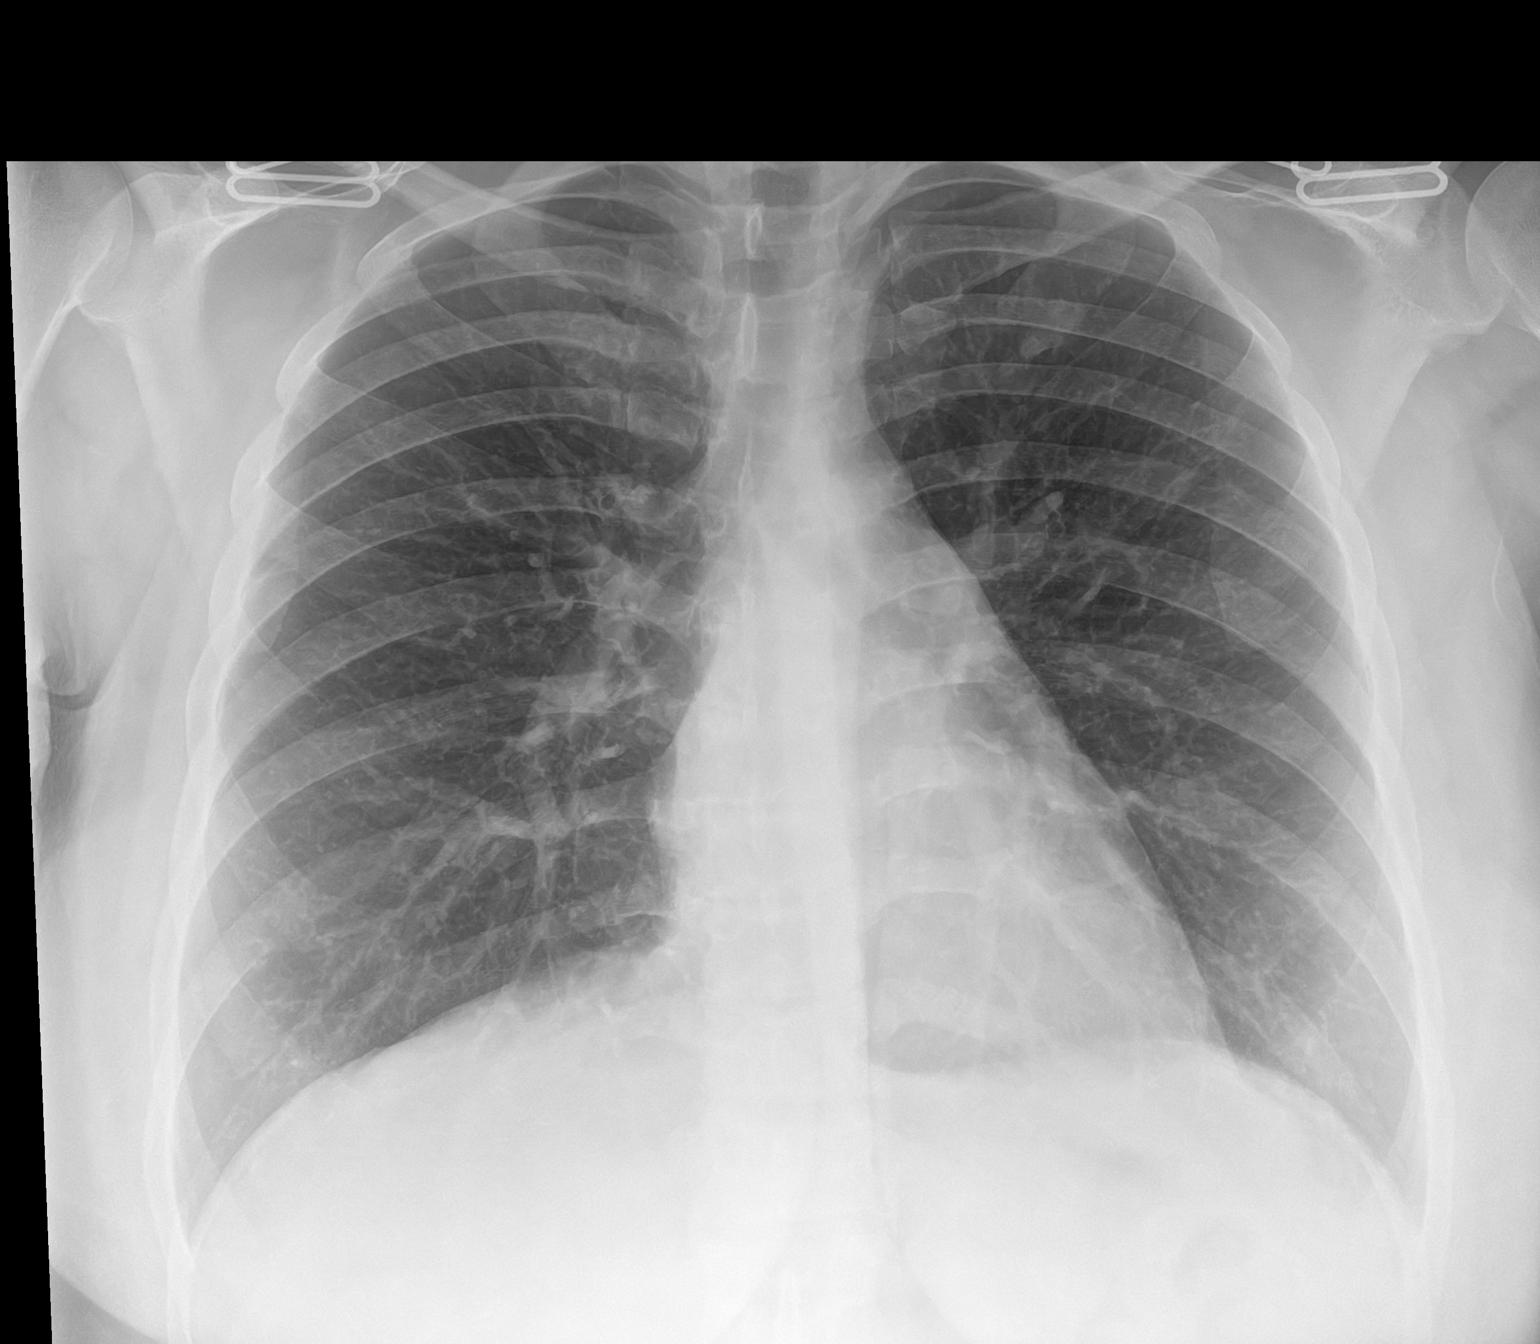

[1 of 1 positions shown; findings below may reference images not displayed]

FINDINGS: The heart size and mediastinal contours are within normal limits.

No focal consolidation. No pulmonary edema. No pleural effusion. No
pneumothorax.

No acute osseous abnormality.
IMPRESSION: No active disease.
